# Patient Record
Sex: Male | Born: 1990 | Race: White | Hispanic: No | Marital: Single | Smoking: Current every day smoker
Health system: Southern US, Community
[De-identification: ages and names within clinical notes are randomized; demographics above are authoritative.]

---

## 2008-08-15 ENCOUNTER — Emergency Department (HOSPITAL_COMMUNITY): Admission: EM | Admit: 2008-08-15 | Discharge: 2008-08-15 | Payer: Self-pay | Admitting: Emergency Medicine

## 2009-01-12 ENCOUNTER — Emergency Department (HOSPITAL_COMMUNITY): Admission: EM | Admit: 2009-01-12 | Discharge: 2009-01-13 | Payer: Self-pay

## 2015-04-03 ENCOUNTER — Encounter (HOSPITAL_COMMUNITY): Payer: Self-pay | Admitting: Emergency Medicine

## 2015-04-03 ENCOUNTER — Emergency Department (HOSPITAL_COMMUNITY)
Admission: EM | Admit: 2015-04-03 | Discharge: 2015-04-03 | Disposition: A | Discharge status: Home / Self Care | Payer: Self-pay | Attending: Emergency Medicine | Admitting: Emergency Medicine

## 2015-04-03 ENCOUNTER — Emergency Department (HOSPITAL_COMMUNITY): Payer: Self-pay

## 2015-04-03 DIAGNOSIS — S93401A Sprain of unspecified ligament of right ankle, initial encounter: Secondary | ICD-10-CM | POA: Insufficient documentation

## 2015-04-03 DIAGNOSIS — Y92310 Basketball court as the place of occurrence of the external cause: Secondary | ICD-10-CM | POA: Insufficient documentation

## 2015-04-03 DIAGNOSIS — F17210 Nicotine dependence, cigarettes, uncomplicated: Secondary | ICD-10-CM | POA: Insufficient documentation

## 2015-04-03 DIAGNOSIS — Y9367 Activity, basketball: Secondary | ICD-10-CM | POA: Insufficient documentation

## 2015-04-03 DIAGNOSIS — Y998 Other external cause status: Secondary | ICD-10-CM | POA: Insufficient documentation

## 2015-04-03 DIAGNOSIS — X501XXA Overexertion from prolonged static or awkward postures, initial encounter: Secondary | ICD-10-CM | POA: Insufficient documentation

## 2015-04-03 MED ORDER — NAPROXEN 500 MG PO TABS: 500.0000 mg | ORAL_TABLET | Freq: Two times a day (BID) | ORAL | Status: DC

## 2015-04-03 NOTE — ED Provider Notes (Signed)
CSN: [id number]     Arrival date & time 04/03/15  1435 History  By signing my name below, I, Placido SouLogan Joldersma, attest that this documentation has been prepared under the direction and in the presence of Sun Wilensky, PA-C. Electronically Signed: Placido SouLogan Joldersma, ED Scribe. 04/03/2015. 2:58 PM.   Chief Complaint  Patient presents with  . Ankle Pain   The history is provided by the patient. No language interpreter was used.    HPI Comments: Cameron Carson is a 24 y.o. male who presents to the Emergency Department complaining of constant, moderate, right ankle pain with onset yesterday. Pt notes that he was playing basketball yesterday and rolled the affected ankle with immediate pain and gradual, worsening, swelling. He notes worsening pain that radiates up his ankle when bearing weight or when ambulating. Pt denies taking any medications or applying ice/heat for his symptoms. He denies any other areas of injury. Pt notes working in [employer] and ambulating regularly. He denies any other associated symptoms at this time.   History reviewed. No pertinent past medical history. History reviewed. No pertinent past surgical history. History reviewed. No pertinent family history. Social History  Substance Use Topics  . Smoking status: Current Every Day Smoker -- 1.00 packs/day    Types: Cigarettes  . Smokeless tobacco: None  . Alcohol Use: No    Review of Systems  Musculoskeletal: Positive for joint swelling and arthralgias.  Skin: Negative for color change, rash and wound.  Neurological: Negative for weakness and numbness.  All other systems reviewed and are negative.  Allergies  Review of patient's allergies indicates no known allergies.  Home Medications   Prior to Admission medications   Not on File   BP 138/80 mmHg  Pulse 108  Temp(Src) 98.2 F (36.8 C) (Oral)  Resp 16  Ht 5\' 6"  (1.676 m)  Wt 150 lb 1 oz (68.068 kg)  BMI 24.23 kg/m2  SpO2 100% Physical Exam   Constitutional: He is oriented to person, place, and time. He appears well-developed and well-nourished.  HENT:  Head: Normocephalic and atraumatic.  Mouth/Throat: No oropharyngeal exudate.  Neck: Normal range of motion. No tracheal deviation present.  Cardiovascular: Normal rate.   Pulmonary/Chest: Effort normal. No respiratory distress.  Abdominal: Soft. There is no tenderness.  Musculoskeletal: Normal range of motion.       Right ankle: He exhibits swelling. Tenderness.  Diffuse tenderness to lateral right ankle; mild edema; no bony deformity; sensation and DP pulse intact.  Compartments soft.  No tenderness proximal to the ankle.   Neurological: He is alert and oriented to person, place, and time.  Skin: Skin is warm and dry. He is not diaphoretic.  Psychiatric: He has a normal mood and affect. His behavior is normal.  Nursing note and vitals reviewed.  ED Course  Procedures  DIAGNOSTIC STUDIES: Oxygen Saturation is 100% on RA, normal by my interpretation.    COORDINATION OF CARE: 2:55 PM Pt presents today due to right ankle pain. Discussed next steps with pt at bedside and he agreed to plan.   Labs Review Labs Reviewed - No data to display  Imaging Review Dg Ankle Complete Right  04/03/2015  CLINICAL DATA:  Right ankle injury playing basketball yesterday. Lateral ankle pain. EXAM: RIGHT ANKLE - COMPLETE 3+ VIEW COMPARISON:  None. FINDINGS: There is no evidence of fracture, dislocation, or joint effusion. There is no evidence of arthropathy or other focal bone abnormality. Soft tissues are unremarkable. IMPRESSION: Negative. Electronically Signed   By:  Charlett Nose M.D.   On: 04/03/2015 15:04   I have personally reviewed and evaluated these images as part of my medical decision-making.    MDM   Final diagnoses:  Ankle sprain, right, initial encounter    ASO applied, crutches given.  Remains NV intact,  Agrees to RICE therapy and NSAID for pain relief.  Ortho f/u in one  week if needed.   I personally performed the services described in this documentation, which was scribed in my presence. The recorded information has been reviewed and is accurate.    Pauline Aus, PA-C 04/03/15 1626  Eber Hong, MD 04/04/15 [phone]

## 2015-04-03 NOTE — ED Notes (Signed)
Injury to right ankle yesterday playing basketball.  Rates pain 8/10.

## 2015-04-03 NOTE — Discharge Instructions (Signed)
Ankle Sprain °An ankle sprain is an injury to the strong, fibrous tissues (ligaments) that hold your ankle bones together.  °HOME CARE  °· Put ice on your ankle for 1-2 days or as told by your doctor. °¨ Put ice in a plastic bag. °¨ Place a towel between your skin and the bag. °¨ Leave the ice on for 15-20 minutes at a time, every 2 hours while you are awake. °· Only take medicine as told by your doctor. °· Raise (elevate) your injured ankle above the level of your heart as much as possible for 2-3 days. °· Use crutches if your doctor tells you to. Slowly put your own weight on the affected ankle. Use the crutches until you can walk without pain. °· If you have a plaster splint: °¨ Do not rest it on anything harder than a pillow for 24 hours. °¨ Do not put weight on it. °¨ Do not get it wet. °¨ Take it off to shower or bathe. °· If given, use an elastic wrap or support stocking for support. Take the wrap off if your toes lose feeling (numb), tingle, or turn cold or blue. °· If you have an air splint: °¨ Add or let out air to make it comfortable. °¨ Take it off at night and to shower and bathe. °¨ Wiggle your toes and move your ankle up and down often while you are wearing it. °GET HELP IF: °· You have rapidly increasing bruising or puffiness (swelling). °· Your toes feel very cold. °· You lose feeling in your foot. °· Your medicine does not help your pain. °GET HELP RIGHT AWAY IF:  °· Your toes lose feeling (numb) or turn blue. °· You have severe pain that is increasing. °MAKE SURE YOU:  °· Understand these instructions. °· Will watch your condition. °· Will get help right away if you are not doing well or get worse. °  °This information is not intended to replace advice given to you by your health care provider. Make sure you discuss any questions you have with your health care provider. °  °Document Released: 09/25/2007 Document Revised: 04/29/2014 Document Reviewed: 10/21/2011 °Elsevier Interactive Patient  Education ©2016 [employer] ° °

## 2016-11-30 ENCOUNTER — Encounter (HOSPITAL_COMMUNITY): Payer: Self-pay | Admitting: Emergency Medicine

## 2016-11-30 ENCOUNTER — Emergency Department (HOSPITAL_COMMUNITY)
Admission: EM | Admit: 2016-11-30 | Discharge: 2016-11-30 | Disposition: A | Discharge status: Home / Self Care | Payer: Self-pay | Attending: Emergency Medicine | Admitting: Emergency Medicine

## 2016-11-30 ENCOUNTER — Emergency Department (HOSPITAL_COMMUNITY): Payer: Self-pay

## 2016-11-30 DIAGNOSIS — W25XXXA Contact with sharp glass, initial encounter: Secondary | ICD-10-CM | POA: Insufficient documentation

## 2016-11-30 DIAGNOSIS — Y998 Other external cause status: Secondary | ICD-10-CM | POA: Insufficient documentation

## 2016-11-30 DIAGNOSIS — F17210 Nicotine dependence, cigarettes, uncomplicated: Secondary | ICD-10-CM | POA: Insufficient documentation

## 2016-11-30 DIAGNOSIS — Y939 Activity, unspecified: Secondary | ICD-10-CM | POA: Insufficient documentation

## 2016-11-30 DIAGNOSIS — Y9289 Other specified places as the place of occurrence of the external cause: Secondary | ICD-10-CM | POA: Insufficient documentation

## 2016-11-30 DIAGNOSIS — S61411A Laceration without foreign body of right hand, initial encounter: Secondary | ICD-10-CM | POA: Insufficient documentation

## 2016-11-30 DIAGNOSIS — Z79899 Other long term (current) drug therapy: Secondary | ICD-10-CM | POA: Insufficient documentation

## 2016-11-30 MED ORDER — LIDOCAINE HCL (PF) 1 % IJ SOLN
5.0000 mL | Freq: Once | INTRAMUSCULAR | Status: AC
  Administered 2016-11-30: 5 mL
  Filled 2016-11-30: qty 5

## 2016-11-30 MED ORDER — CEPHALEXIN 500 MG PO CAPS: 500.0000 mg | ORAL_CAPSULE | Freq: Four times a day (QID) | ORAL | 0 refills | Status: AC

## 2016-11-30 NOTE — ED Triage Notes (Signed)
Punched a glass picture frame this morning around 4am.  Lac to rt palm. No active bleeding

## 2016-11-30 NOTE — Discharge Instructions (Signed)
Have your sutures removed in 10 days.  Keep your wound clean and dry,  Until a good scab forms - you may then wash gently twice daily with mild soap and water, but dry completely after.  Get rechecked for any sign of infection (redness,  Swelling,  Increased pain or drainage of purulent fluid).  Take the antibiotics prescribed as discussed as this wound has an increased risk of infection and the antibiotics will be important for you to take.

## 2016-12-02 NOTE — ED Provider Notes (Signed)
[facility]-EMERGENCY DEPT Provider Note   CSN: [id number] Arrival date & time: 11/30/16  2025     History   Chief Complaint Chief Complaint  Patient presents with  . Laceration    HPI Cameron Carson is a 26 y.o. male.  The history is provided by the patient.  Laceration   Incident onset: 16 hours ago. The laceration is located on the right hand (Pt denies numbness in hand or fingers.  No radiation of pain. Pain worsened with palpation only.  No tx prior to arrival). The laceration is 1 cm in size. The laceration mechanism was a broken glass (Pt punched a mirror.). The pain is at a severity of 2/10. The pain is mild. The pain has been constant since onset. It is unknown if a foreign body is present. His tetanus status is UTD.    History reviewed. No pertinent past medical history.  There are no active problems to display for this patient.   History reviewed. No pertinent surgical history.     Home Medications    Prior to Admission medications   Medication Sig Start Date End Date Taking? Authorizing Provider  Aspirin-Acetaminophen (GOODY BODY PAIN) 500-325 MG PACK Take 1 packet by mouth daily as needed.   Yes [provider]  cephALEXin (KEFLEX) 500 MG capsule Take 1 capsule (500 mg total) by mouth 4 (four) times daily. 11/30/16   Burgess Amor, PA-C    Family History No family history on file.  Social History Social History  Substance Use Topics  . Smoking status: Current Every Day Smoker    Packs/day: 1.00    Types: Cigarettes  . Smokeless tobacco: Never Used  . Alcohol use Yes     Comment: occ     Allergies   Patient has no known allergies.   Review of Systems Review of Systems  Constitutional: Negative for chills and fever.  Musculoskeletal: Negative for arthralgias.  Skin: Positive for wound.  Neurological: Negative for weakness and numbness.     Physical Exam Updated Vital Signs BP 126/77 (BP Location: Right Arm)   Pulse 97   Temp 99 F  (37.2 C) (Oral)   Resp 19   Ht 5\' 6"  (1.676 m)   Wt 70.3 kg (155 lb)   SpO2 99%   BMI 25.02 kg/m   Physical Exam  Constitutional: He is oriented to person, place, and time. He appears well-developed and well-nourished.  HENT:  Head: Normocephalic.  Cardiovascular: Normal rate.   Pulmonary/Chest: Effort normal.  Musculoskeletal: He exhibits tenderness.  Neurological: He is alert and oriented to person, place, and time. He has normal strength.  Pt has full resistance to flex/ext of all digits and wrist.  Skin: Laceration noted.  1 cm subcutaneous laceration right palm, hemostatic. Copious dried blood around the wound site.  Superficial abrasion dorsal mid phalanx right 5th finger.     ED Treatments / Results  Labs (all labs ordered are listed, but only abnormal results are displayed) Labs Reviewed - No data to display  EKG  EKG Interpretation None       Radiology Dg Hand Complete Right  Result Date: 11/30/2016 CLINICAL DATA:  Right hand laceration. Rule out foreign body. Patient punched glass picture frame this morning with laceration on the palm. EXAM: RIGHT HAND - COMPLETE 3+ VIEW COMPARISON:  None. FINDINGS: There is no evidence of acute fracture or dislocation. Soft tissue laceration along the palmar aspect of the first webspace between the thumb and index finger are identified  with minimal subcutaneous emphysema. Soft tissue debris seen on the oblique image adjacent to the first metacarpal neck without evidence of radiopaque foreign body. IMPRESSION: Soft tissue lacerations of the right hand without apparent radiopaque foreign body. No underlying fracture or dislocations. Electronically Signed   By: Tollie Ethavid  Kwon M.D.   On: 11/30/2016 21:46    Procedures Procedures (including critical care time)  LACERATION REPAIR Performed by: Burgess AmorIDOL, Kden Wagster Authorized by: Burgess AmorIDOL, Kobie Matkins Consent: Verbal consent obtained. Risks and benefits: risks, benefits and alternatives were  discussed Consent given by: patient Patient identity confirmed: provided demographic data Prepped and Draped in normal sterile fashion Wound explored with no fb appreciated.  Laceration Location: right hand  Laceration Length: 1cm  No Foreign Bodies seen or palpated  Anesthesia: local infiltration  Local anesthetic: lidocaine 1% without epinephrine  Anesthetic total: 2 ml  Irrigation method: syringe after betadine wash Amount of cleaning: copious flushing and scrubbing of wound using saline soaked sterile 4x4's  Skin closure: ethilon 4-0   Number of sutures: 3  Technique: simple interupted  Patient tolerance: Patient tolerated the procedure well with no immediate complications.     Medications Ordered in ED Medications  lidocaine (PF) (XYLOCAINE) 1 % injection 5 mL (5 mLs Other Given 11/30/16 2114)     Initial Impression / Assessment and Plan / ED Course  I have reviewed the triage vital signs and the nursing notes.  Pertinent labs & imaging results that were available during my care of the patient were reviewed by me and considered in my medical decision making (see chart for details).     Discussed risks and benefits of suturing wound given age, pt understands increased risk of infection and wishes to proceed. Advised close watch for signs of infection, wound instructions given, 10 suture removal, keflex prescribed for prophylaxis.  Final Clinical Impressions(s) / ED Diagnoses   Final diagnoses:  Laceration of right hand without foreign body, initial encounter    New Prescriptions Discharge Medication List as of 11/30/2016 10:00 PM    START taking these medications   Details  cephALEXin (KEFLEX) 500 MG capsule Take 1 capsule (500 mg total) by mouth 4 (four) times daily., Starting Sat 11/30/2016, Print         Burgess Amordol, Anaysia Germer, Cordelia Poche-C 12/02/16 1238    Bethann BerkshireZammit, Joseph, MD 12/02/16 1336

## 2017-03-30 IMAGING — DX DG ANKLE COMPLETE 3+V*R*
3 series · 3 of 3 positions shown · non-contrast
Comparison: None.

CLINICAL DATA: Right ankle injury playing basketball yesterday.
Lateral ankle pain.

EXAM:
RIGHT ANKLE - COMPLETE 3+ VIEW

[ankle ap]
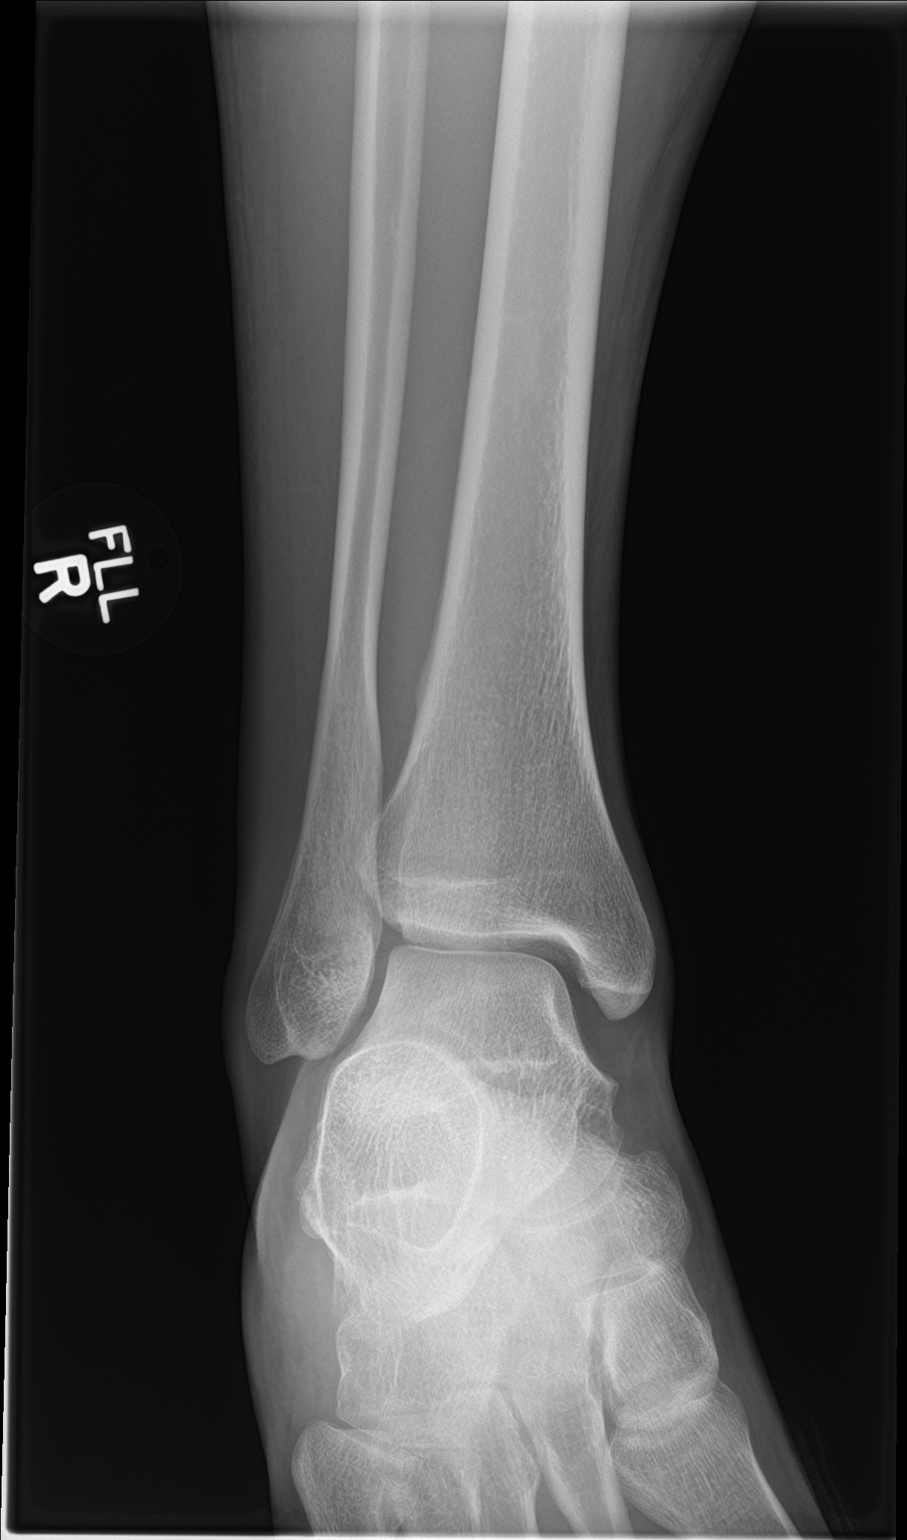

[ankle obl]
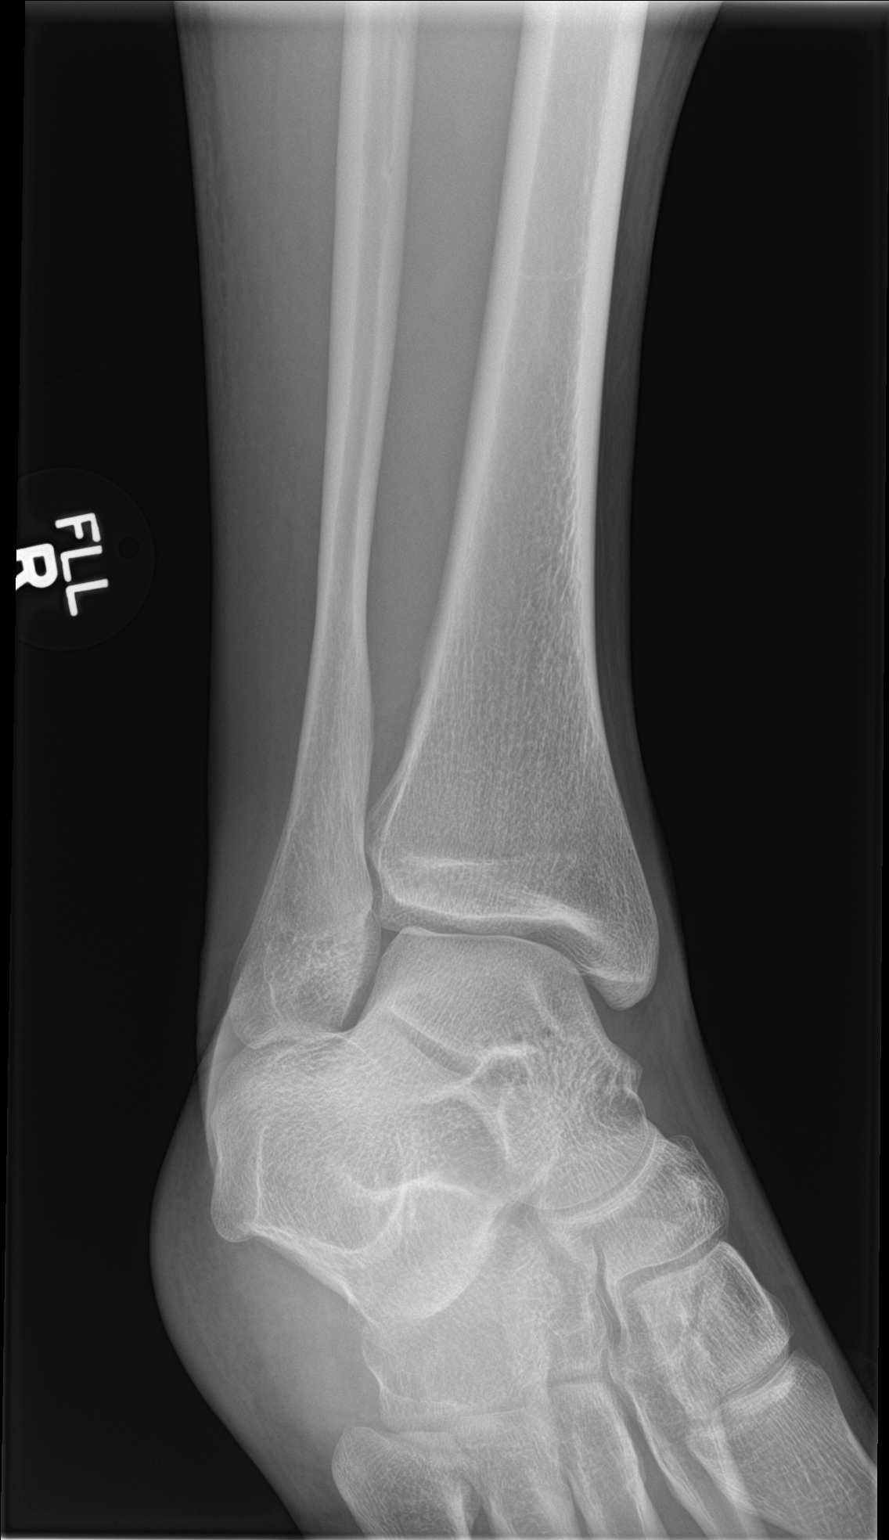

[ankle lat]
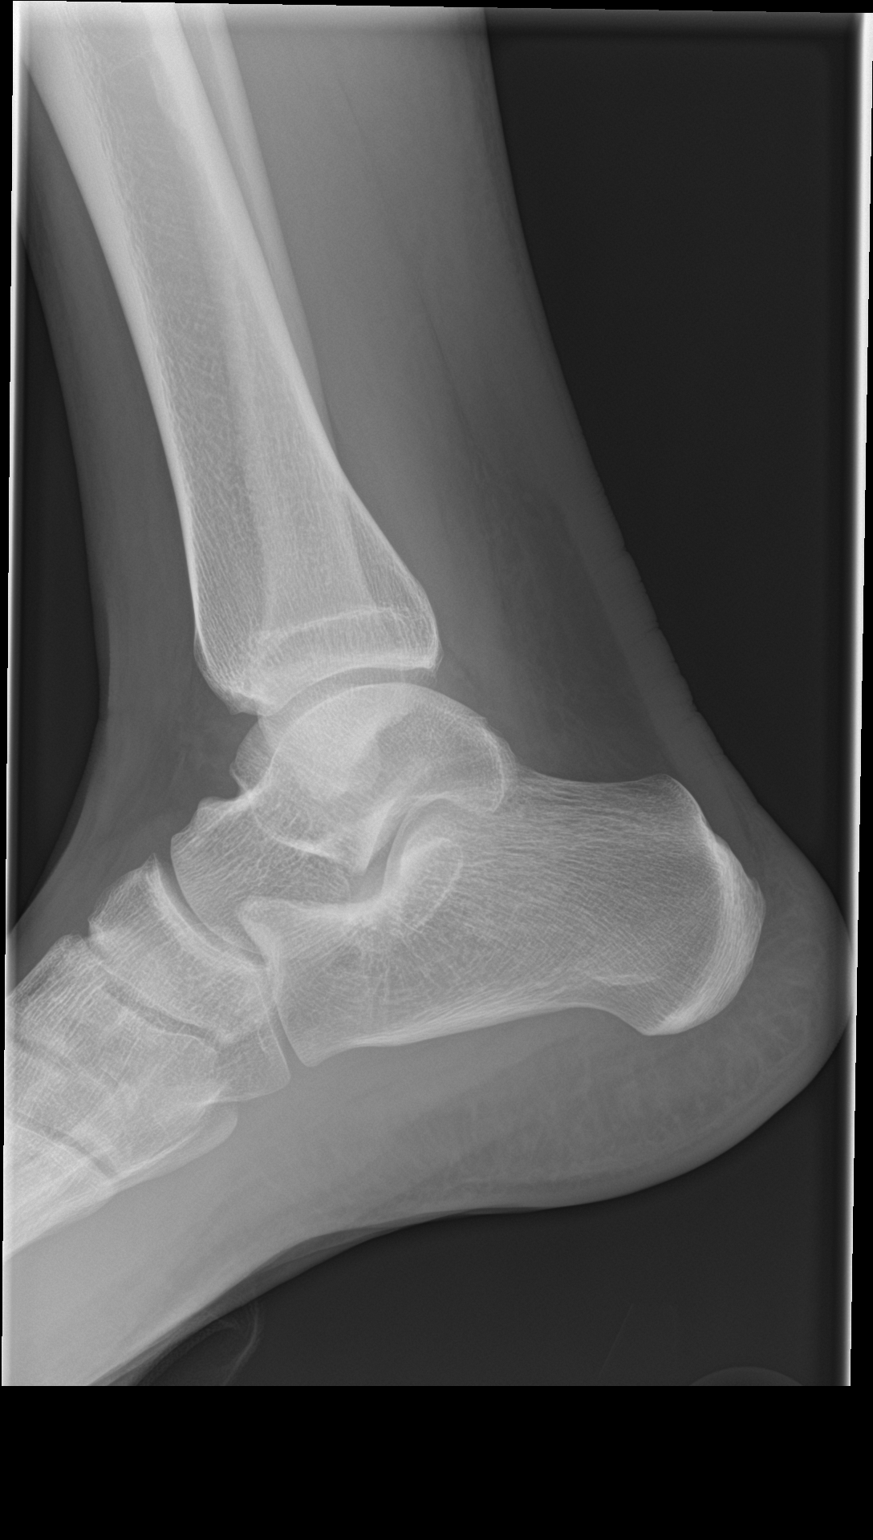

[3 of 3 positions shown; findings below may reference images not displayed]

FINDINGS: There is no evidence of fracture, dislocation, or joint effusion.
There is no evidence of arthropathy or other focal bone abnormality.
Soft tissues are unremarkable.
IMPRESSION: Negative.

## 2018-11-27 IMAGING — CR DG HAND COMPLETE 3+V*R*
1 series · 3 of 3 positions shown · non-contrast
Comparison: None.

CLINICAL DATA: Right hand laceration. Rule out foreign body.
Patient punched glass picture frame this morning with laceration on
the palm.

EXAM:
RIGHT HAND - COMPLETE 3+ VIEW

[Series 1: pa · 0.17mm/px · 3 of 3 slices shown]
[im 1/3]
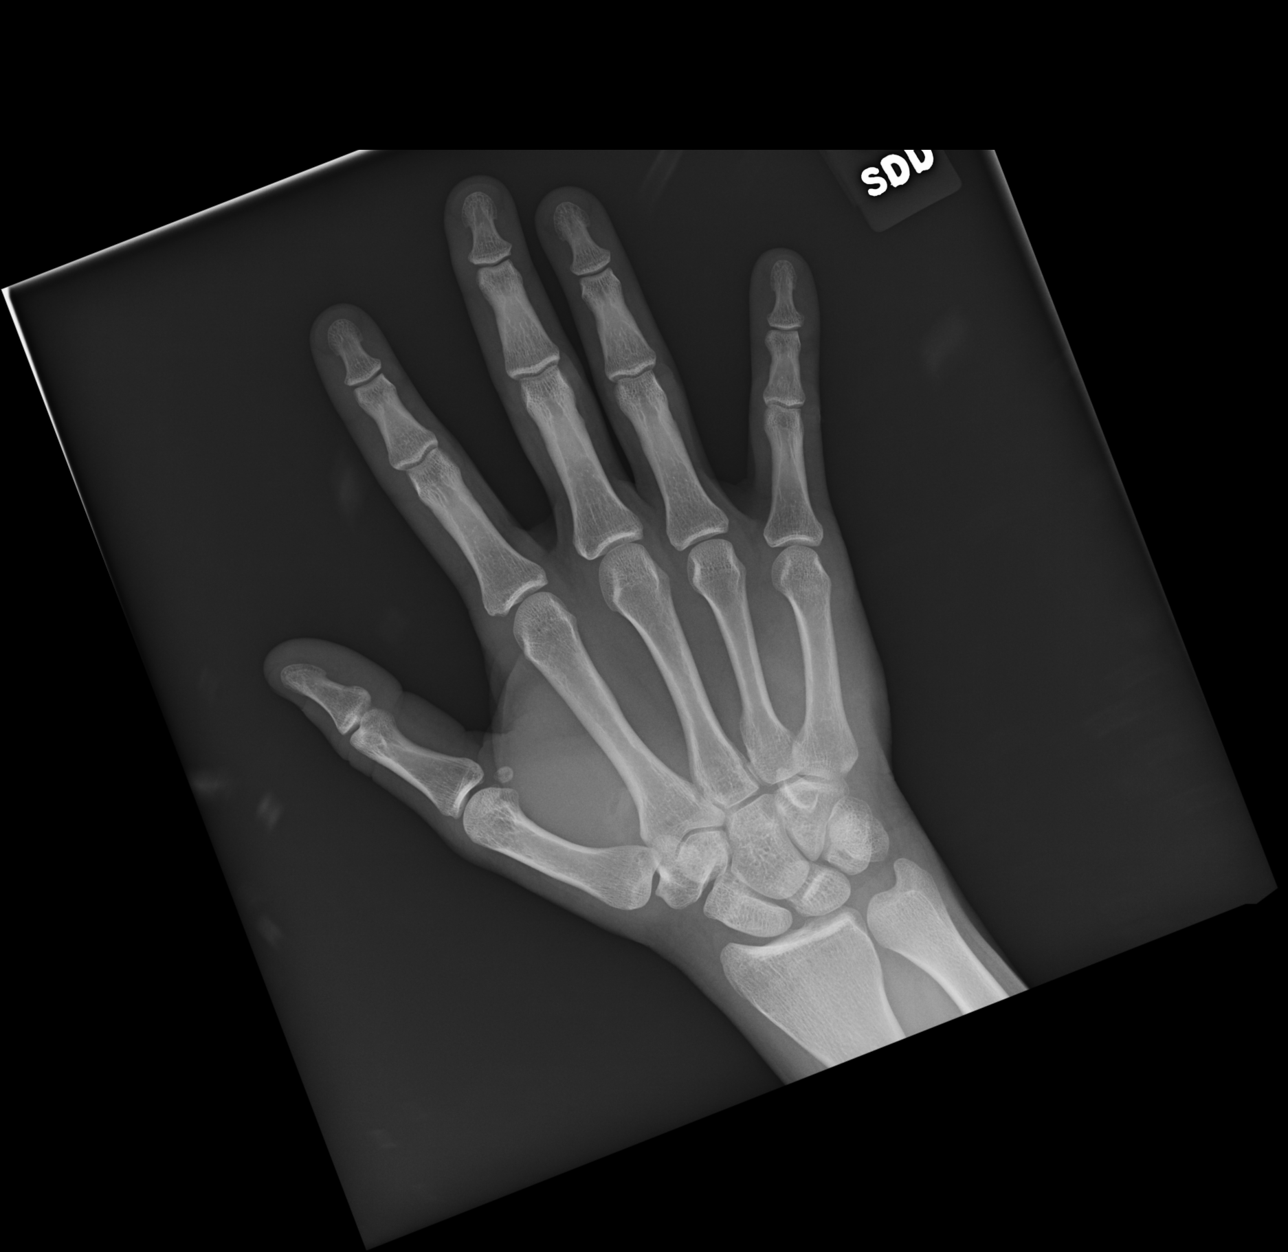
[im 2/3]
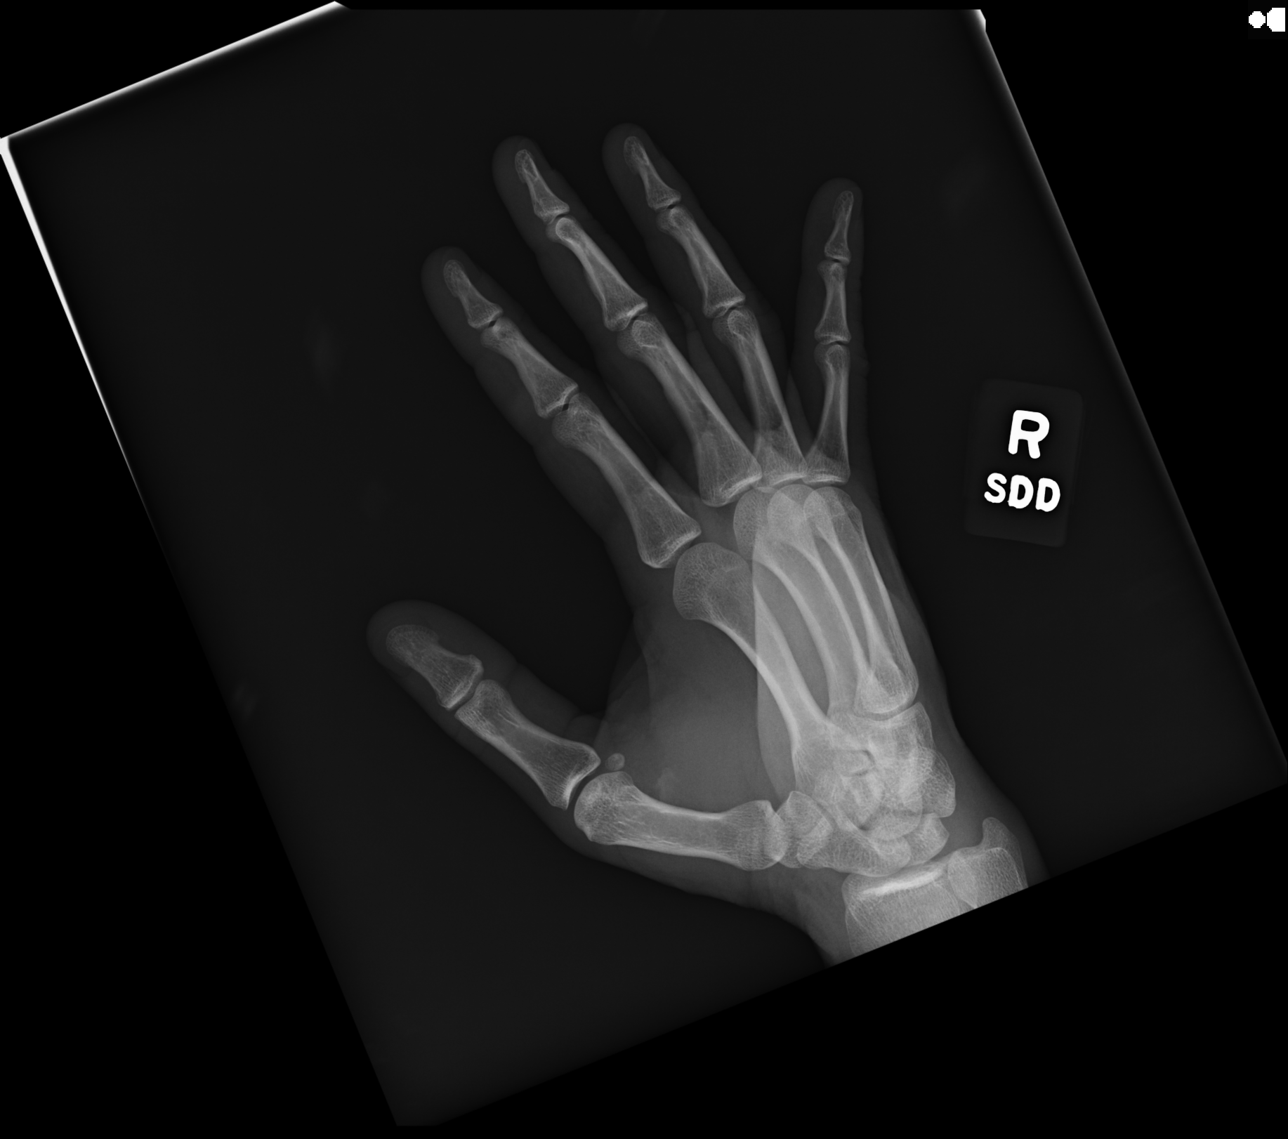
[im 3/3]
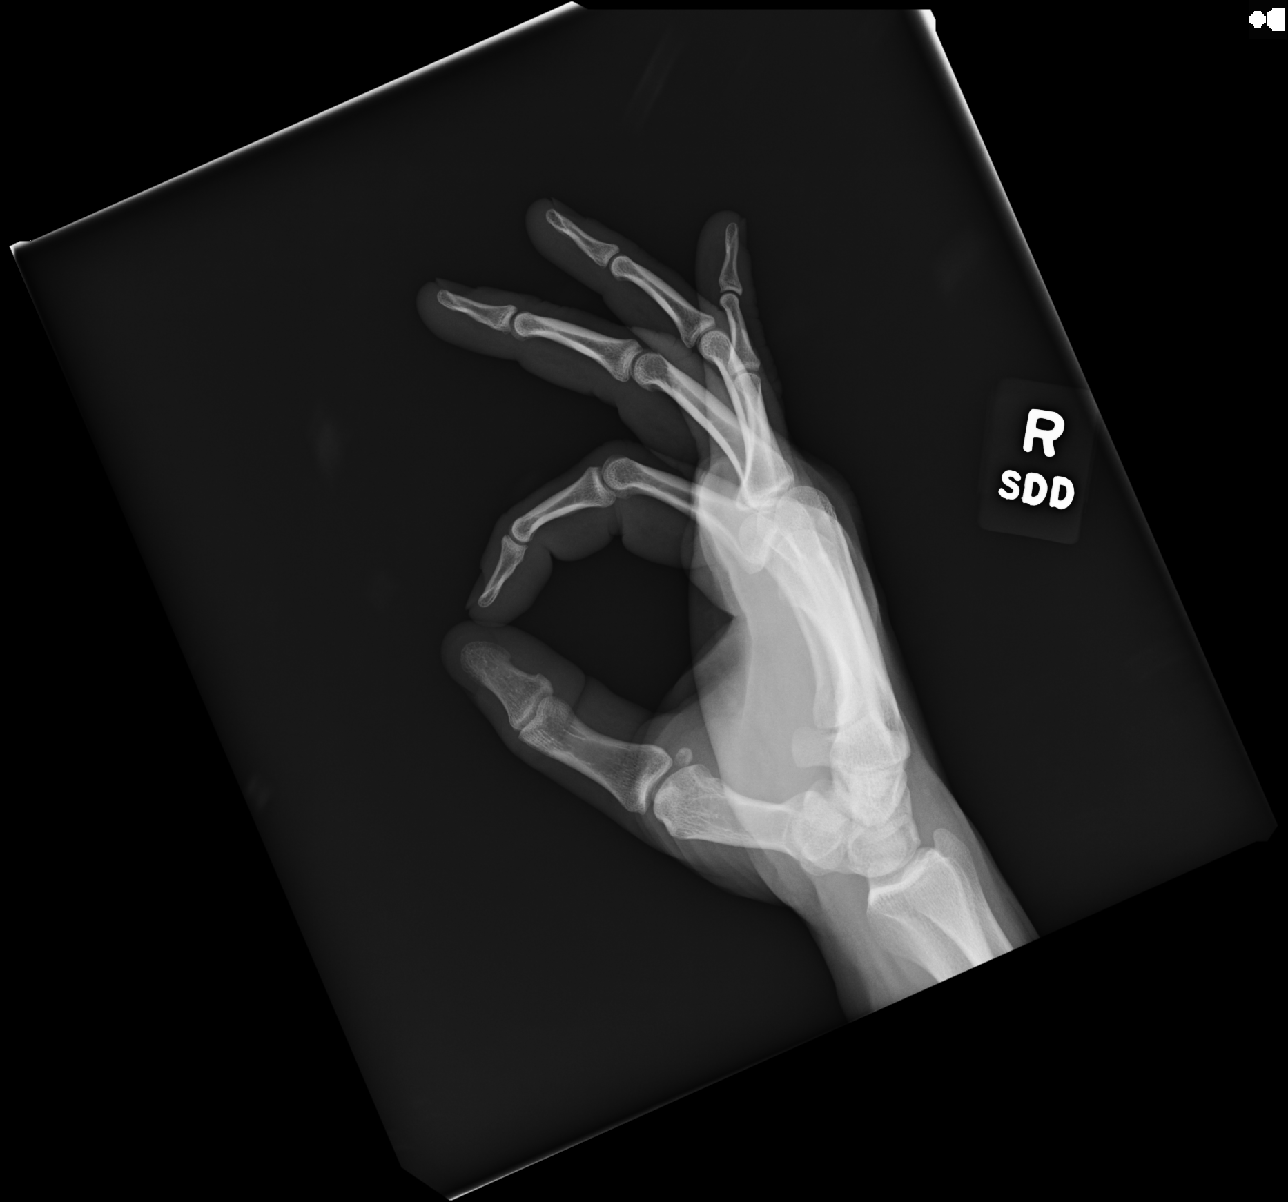

[3 of 3 positions shown; findings below may reference images not displayed]

FINDINGS: There is no evidence of acute fracture or dislocation. Soft tissue
laceration along the palmar aspect of the first webspace between the
thumb and index finger are identified with minimal subcutaneous
emphysema. Soft tissue debris seen on the oblique image adjacent to
the first metacarpal neck without evidence of radiopaque foreign
body.
IMPRESSION: Soft tissue lacerations of the right hand without apparent
radiopaque foreign body. No underlying fracture or dislocations.

## 2019-04-26 ENCOUNTER — Encounter (HOSPITAL_COMMUNITY): Payer: Self-pay | Admitting: Emergency Medicine

## 2019-04-26 ENCOUNTER — Emergency Department (HOSPITAL_COMMUNITY)
Admission: EM | Admit: 2019-04-26 | Discharge: 2019-04-26 | Disposition: A | Discharge status: Home / Self Care | Payer: Self-pay | Attending: Emergency Medicine | Admitting: Emergency Medicine

## 2019-04-26 ENCOUNTER — Other Ambulatory Visit: Payer: Self-pay

## 2019-04-26 DIAGNOSIS — R197 Diarrhea, unspecified: Secondary | ICD-10-CM | POA: Insufficient documentation

## 2019-04-26 DIAGNOSIS — Z5321 Procedure and treatment not carried out due to patient leaving prior to being seen by health care provider: Secondary | ICD-10-CM | POA: Insufficient documentation

## 2019-04-26 DIAGNOSIS — R509 Fever, unspecified: Secondary | ICD-10-CM | POA: Insufficient documentation

## 2019-04-26 NOTE — ED Notes (Signed)
Called to room x 1 with no answer  

## 2019-04-26 NOTE — ED Triage Notes (Signed)
Fever and diarrhea x 2 days ago

## 2019-04-27 ENCOUNTER — Ambulatory Visit: Payer: Self-pay | Attending: Internal Medicine

## 2019-04-29 ENCOUNTER — Other Ambulatory Visit: Payer: Self-pay

## 2019-04-29 ENCOUNTER — Ambulatory Visit: Payer: Self-pay | Attending: Internal Medicine

## 2019-04-29 DIAGNOSIS — Z20822 Contact with and (suspected) exposure to covid-19: Secondary | ICD-10-CM | POA: Insufficient documentation

## 2019-05-01 LAB — NOVEL CORONAVIRUS, NAA: SARS-CoV-2, NAA: NOT DETECTED

## 2022-09-30 DIAGNOSIS — Z3009 Encounter for other general counseling and advice on contraception: Secondary | ICD-10-CM | POA: Diagnosis not present

## 2023-03-06 DIAGNOSIS — L089 Local infection of the skin and subcutaneous tissue, unspecified: Secondary | ICD-10-CM | POA: Diagnosis not present

## 2023-09-18 DIAGNOSIS — L089 Local infection of the skin and subcutaneous tissue, unspecified: Secondary | ICD-10-CM | POA: Diagnosis not present

## 2023-09-18 DIAGNOSIS — Z8614 Personal history of Methicillin resistant Staphylococcus aureus infection: Secondary | ICD-10-CM | POA: Diagnosis not present

## 2023-10-23 DIAGNOSIS — K529 Noninfective gastroenteritis and colitis, unspecified: Secondary | ICD-10-CM | POA: Diagnosis not present

## 2023-10-23 DIAGNOSIS — Z87891 Personal history of nicotine dependence: Secondary | ICD-10-CM | POA: Diagnosis not present

## 2023-10-23 DIAGNOSIS — R110 Nausea: Secondary | ICD-10-CM | POA: Diagnosis not present

## 2023-12-09 DIAGNOSIS — Z87891 Personal history of nicotine dependence: Secondary | ICD-10-CM | POA: Diagnosis not present

## 2023-12-09 DIAGNOSIS — T161XXA Foreign body in right ear, initial encounter: Secondary | ICD-10-CM | POA: Diagnosis not present

## 2023-12-09 DIAGNOSIS — Z792 Long term (current) use of antibiotics: Secondary | ICD-10-CM | POA: Diagnosis not present

## 2024-01-29 DIAGNOSIS — B86 Scabies: Secondary | ICD-10-CM | POA: Diagnosis not present

## 2024-03-20 DIAGNOSIS — H9202 Otalgia, left ear: Secondary | ICD-10-CM | POA: Diagnosis not present

## 2024-03-20 DIAGNOSIS — H6122 Impacted cerumen, left ear: Secondary | ICD-10-CM | POA: Diagnosis not present
